# Patient Record
Sex: Female | Born: 1960 | Race: White | Hispanic: No | Smoking: Never smoker
Health system: Southern US, Community
[De-identification: ages and names within clinical notes are randomized; demographics above are authoritative.]

## PROBLEM LIST (undated history)

## (undated) DIAGNOSIS — I499 Cardiac arrhythmia, unspecified: Secondary | ICD-10-CM

## (undated) DIAGNOSIS — J45909 Unspecified asthma, uncomplicated: Secondary | ICD-10-CM

## (undated) DIAGNOSIS — H269 Unspecified cataract: Secondary | ICD-10-CM

## (undated) DIAGNOSIS — F329 Major depressive disorder, single episode, unspecified: Secondary | ICD-10-CM

## (undated) DIAGNOSIS — M1990 Unspecified osteoarthritis, unspecified site: Secondary | ICD-10-CM

## (undated) DIAGNOSIS — F32A Depression, unspecified: Secondary | ICD-10-CM

## (undated) DIAGNOSIS — K589 Irritable bowel syndrome without diarrhea: Secondary | ICD-10-CM

## (undated) HISTORY — PX: CHOLECYSTECTOMY: SHX55

## (undated) HISTORY — PX: OOPHORECTOMY: SHX86

## (undated) HISTORY — PX: TONSILLECTOMY: SUR1361

## (undated) HISTORY — PX: ABDOMINAL HYSTERECTOMY: SHX81

---

## 1998-11-27 ENCOUNTER — Ambulatory Visit (HOSPITAL_COMMUNITY): Admission: RE | Admit: 1998-11-27 | Discharge: 1998-11-27 | Payer: Self-pay | Admitting: General Practice

## 1998-11-27 ENCOUNTER — Encounter: Payer: Self-pay | Admitting: General Practice

## 2006-11-13 ENCOUNTER — Emergency Department: Payer: Self-pay | Admitting: Emergency Medicine

## 2007-04-13 ENCOUNTER — Ambulatory Visit: Payer: Self-pay | Admitting: Internal Medicine

## 2009-06-10 ENCOUNTER — Ambulatory Visit: Payer: Self-pay | Admitting: Internal Medicine

## 2009-06-18 ENCOUNTER — Ambulatory Visit: Payer: Self-pay | Admitting: Internal Medicine

## 2009-07-08 ENCOUNTER — Ambulatory Visit: Payer: Self-pay | Admitting: Pain Medicine

## 2009-07-29 ENCOUNTER — Ambulatory Visit: Payer: Self-pay | Admitting: Pain Medicine

## 2009-08-19 ENCOUNTER — Ambulatory Visit: Payer: Self-pay | Admitting: Pain Medicine

## 2009-09-04 ENCOUNTER — Ambulatory Visit: Payer: Self-pay | Admitting: Pain Medicine

## 2011-08-11 ENCOUNTER — Ambulatory Visit: Payer: Self-pay | Admitting: Internal Medicine

## 2015-04-04 ENCOUNTER — Other Ambulatory Visit: Payer: Self-pay | Admitting: Specialist

## 2015-04-04 DIAGNOSIS — J9801 Acute bronchospasm: Secondary | ICD-10-CM

## 2015-04-09 ENCOUNTER — Ambulatory Visit
Admission: RE | Admit: 2015-04-09 | Discharge: 2015-04-09 | Disposition: A | Discharge status: Home / Self Care | Payer: BLUE CROSS/BLUE SHIELD | Source: Ambulatory Visit | Attending: Specialist | Admitting: Specialist

## 2015-04-09 DIAGNOSIS — R918 Other nonspecific abnormal finding of lung field: Secondary | ICD-10-CM | POA: Diagnosis not present

## 2015-04-09 DIAGNOSIS — J9801 Acute bronchospasm: Secondary | ICD-10-CM | POA: Diagnosis present

## 2015-04-09 DIAGNOSIS — Z9049 Acquired absence of other specified parts of digestive tract: Secondary | ICD-10-CM | POA: Insufficient documentation

## 2015-04-09 DIAGNOSIS — R05 Cough: Secondary | ICD-10-CM | POA: Insufficient documentation

## 2015-11-04 ENCOUNTER — Other Ambulatory Visit: Payer: Self-pay | Admitting: Orthopedic Surgery

## 2015-11-04 DIAGNOSIS — M2391 Unspecified internal derangement of right knee: Secondary | ICD-10-CM

## 2015-11-04 DIAGNOSIS — M1711 Unilateral primary osteoarthritis, right knee: Secondary | ICD-10-CM

## 2015-11-10 ENCOUNTER — Ambulatory Visit: Payer: BLUE CROSS/BLUE SHIELD

## 2015-11-13 ENCOUNTER — Encounter: Payer: BLUE CROSS/BLUE SHIELD | Admitting: Physical Therapy

## 2015-11-14 ENCOUNTER — Encounter: Payer: Self-pay | Admitting: Radiology

## 2015-11-14 ENCOUNTER — Ambulatory Visit
Admission: RE | Admit: 2015-11-14 | Discharge: 2015-11-14 | Disposition: A | Discharge status: Home / Self Care | Payer: BLUE CROSS/BLUE SHIELD | Source: Ambulatory Visit | Attending: Orthopedic Surgery | Admitting: Orthopedic Surgery

## 2015-11-14 DIAGNOSIS — M1711 Unilateral primary osteoarthritis, right knee: Secondary | ICD-10-CM | POA: Diagnosis present

## 2015-11-14 DIAGNOSIS — M2391 Unspecified internal derangement of right knee: Secondary | ICD-10-CM

## 2015-11-18 ENCOUNTER — Encounter: Payer: BLUE CROSS/BLUE SHIELD | Admitting: Physical Therapy

## 2015-11-20 ENCOUNTER — Encounter: Payer: BLUE CROSS/BLUE SHIELD | Admitting: Physical Therapy

## 2017-01-29 DIAGNOSIS — H269 Unspecified cataract: Secondary | ICD-10-CM

## 2017-01-29 HISTORY — DX: Unspecified cataract: H26.9

## 2017-02-18 ENCOUNTER — Encounter: Payer: Self-pay | Admitting: *Deleted

## 2017-02-23 ENCOUNTER — Encounter: Payer: Self-pay | Admitting: *Deleted

## 2017-02-23 ENCOUNTER — Other Ambulatory Visit: Payer: Self-pay

## 2017-02-23 ENCOUNTER — Ambulatory Visit: Payer: BLUE CROSS/BLUE SHIELD | Admitting: Anesthesiology

## 2017-02-23 ENCOUNTER — Ambulatory Visit
Admission: RE | Admit: 2017-02-23 | Discharge: 2017-02-23 | Disposition: A | Discharge status: Home / Self Care | Payer: BLUE CROSS/BLUE SHIELD | Source: Ambulatory Visit | Attending: Ophthalmology | Admitting: Ophthalmology

## 2017-02-23 ENCOUNTER — Encounter
Admission: RE | Disposition: A | Discharge status: Home / Self Care | Payer: Self-pay | Source: Ambulatory Visit | Attending: Ophthalmology

## 2017-02-23 DIAGNOSIS — Z885 Allergy status to narcotic agent status: Secondary | ICD-10-CM | POA: Insufficient documentation

## 2017-02-23 DIAGNOSIS — J449 Chronic obstructive pulmonary disease, unspecified: Secondary | ICD-10-CM | POA: Diagnosis not present

## 2017-02-23 DIAGNOSIS — M1990 Unspecified osteoarthritis, unspecified site: Secondary | ICD-10-CM | POA: Diagnosis not present

## 2017-02-23 DIAGNOSIS — K589 Irritable bowel syndrome without diarrhea: Secondary | ICD-10-CM | POA: Diagnosis not present

## 2017-02-23 DIAGNOSIS — Z79899 Other long term (current) drug therapy: Secondary | ICD-10-CM | POA: Diagnosis not present

## 2017-02-23 DIAGNOSIS — Z7982 Long term (current) use of aspirin: Secondary | ICD-10-CM | POA: Diagnosis not present

## 2017-02-23 DIAGNOSIS — Z91040 Latex allergy status: Secondary | ICD-10-CM | POA: Diagnosis not present

## 2017-02-23 DIAGNOSIS — F329 Major depressive disorder, single episode, unspecified: Secondary | ICD-10-CM | POA: Diagnosis not present

## 2017-02-23 DIAGNOSIS — Z9049 Acquired absence of other specified parts of digestive tract: Secondary | ICD-10-CM | POA: Insufficient documentation

## 2017-02-23 DIAGNOSIS — Z90710 Acquired absence of both cervix and uterus: Secondary | ICD-10-CM | POA: Insufficient documentation

## 2017-02-23 DIAGNOSIS — Z79890 Hormone replacement therapy (postmenopausal): Secondary | ICD-10-CM | POA: Insufficient documentation

## 2017-02-23 DIAGNOSIS — H2512 Age-related nuclear cataract, left eye: Secondary | ICD-10-CM | POA: Diagnosis present

## 2017-02-23 HISTORY — DX: Major depressive disorder, single episode, unspecified: F32.9

## 2017-02-23 HISTORY — DX: Irritable bowel syndrome without diarrhea: K58.9

## 2017-02-23 HISTORY — DX: Unspecified cataract: H26.9

## 2017-02-23 HISTORY — DX: Unspecified asthma, uncomplicated: J45.909

## 2017-02-23 HISTORY — DX: Unspecified osteoarthritis, unspecified site: M19.90

## 2017-02-23 HISTORY — DX: Depression, unspecified: F32.A

## 2017-02-23 HISTORY — DX: Cardiac arrhythmia, unspecified: I49.9

## 2017-02-23 HISTORY — PX: CATARACT EXTRACTION W/PHACO: SHX586

## 2017-02-23 LAB — GLUCOSE, CAPILLARY: Glucose-Capillary: 97 mg/dL (ref 65–99)

## 2017-02-23 SURGERY — PHACOEMULSIFICATION, CATARACT, WITH IOL INSERTION
Anesthesia: Monitor Anesthesia Care | Site: Eye | Laterality: Left | Wound class: Clean

## 2017-02-23 MED ORDER — MIDAZOLAM HCL 2 MG/2ML IJ SOLN
INTRAMUSCULAR | Status: DC | PRN
  Administered 2017-02-23: 2 mg via INTRAVENOUS

## 2017-02-23 MED ORDER — NA CHONDROIT SULF-NA HYALURON 40-17 MG/ML IO SOLN
INTRAOCULAR | Status: AC
  Filled 2017-02-23: qty 1

## 2017-02-23 MED ORDER — ARMC OPHTHALMIC DILATING DROPS
1.0000 "application " | OPHTHALMIC | Status: AC
  Administered 2017-02-23 (×3): 1 via OPHTHALMIC

## 2017-02-23 MED ORDER — SODIUM CHLORIDE 0.9 % IV SOLN
INTRAVENOUS | Status: DC
Start: 2017-02-23 — End: 2017-02-23
  Administered 2017-02-23 (×2): via INTRAVENOUS

## 2017-02-23 MED ORDER — ONDANSETRON HCL 4 MG/2ML IJ SOLN
INTRAMUSCULAR | Status: AC
  Filled 2017-02-23: qty 2

## 2017-02-23 MED ORDER — EPINEPHRINE PF 1 MG/ML IJ SOLN
INTRAOCULAR | Status: DC | PRN
  Administered 2017-02-23: 1 mL via OPHTHALMIC

## 2017-02-23 MED ORDER — ARMC OPHTHALMIC DILATING DROPS
OPHTHALMIC | Status: AC
  Administered 2017-02-23: 1 via OPHTHALMIC
  Filled 2017-02-23: qty 0.4

## 2017-02-23 MED ORDER — ONDANSETRON HCL 4 MG/2ML IJ SOLN
INTRAMUSCULAR | Status: DC | PRN
  Administered 2017-02-23: 4 mg via INTRAVENOUS

## 2017-02-23 MED ORDER — MIDAZOLAM HCL 2 MG/2ML IJ SOLN
INTRAMUSCULAR | Status: AC
  Filled 2017-02-23: qty 2

## 2017-02-23 MED ORDER — CARBACHOL 0.01 % IO SOLN
INTRAOCULAR | Status: DC | PRN
  Administered 2017-02-23: .5 mL via INTRAOCULAR

## 2017-02-23 MED ORDER — MOXIFLOXACIN HCL 0.5 % OP SOLN
OPHTHALMIC | Status: AC
  Filled 2017-02-23: qty 3

## 2017-02-23 MED ORDER — MOXIFLOXACIN HCL 0.5 % OP SOLN: 1.0000 [drp] | Freq: Once | OPHTHALMIC | Status: DC

## 2017-02-23 MED ORDER — MOXIFLOXACIN HCL 0.5 % OP SOLN
OPHTHALMIC | Status: DC | PRN
  Administered 2017-02-23: .2 mL via OPHTHALMIC

## 2017-02-23 MED ORDER — FENTANYL CITRATE (PF) 100 MCG/2ML IJ SOLN
INTRAMUSCULAR | Status: DC | PRN
  Administered 2017-02-23 (×2): 50 ug via INTRAVENOUS

## 2017-02-23 MED ORDER — NA CHONDROIT SULF-NA HYALURON 40-17 MG/ML IO SOLN
INTRAOCULAR | Status: DC | PRN
  Administered 2017-02-23: 1 mL via INTRAOCULAR

## 2017-02-23 MED ORDER — FENTANYL CITRATE (PF) 100 MCG/2ML IJ SOLN
INTRAMUSCULAR | Status: AC
  Filled 2017-02-23: qty 2

## 2017-02-23 MED ORDER — POVIDONE-IODINE 5 % OP SOLN
OPHTHALMIC | Status: DC | PRN
  Administered 2017-02-23: 1 via OPHTHALMIC

## 2017-02-23 MED ORDER — LIDOCAINE HCL (PF) 4 % IJ SOLN
INTRAOCULAR | Status: DC | PRN
  Administered 2017-02-23: 2 mL via OPHTHALMIC

## 2017-02-23 SURGICAL SUPPLY — 18 items
GLOVE BIO SURGEON STRL SZ8 (GLOVE) ×3 IMPLANT
GLOVE BIOGEL M 6.5 STRL (GLOVE) ×3 IMPLANT
GLOVE SURG LX 8.0 MICRO (GLOVE) ×2
GLOVE SURG LX STRL 8.0 MICRO (GLOVE) ×1 IMPLANT
GOWN STRL REUS W/ TWL LRG LVL3 (GOWN DISPOSABLE) ×2 IMPLANT
GOWN STRL REUS W/TWL LRG LVL3 (GOWN DISPOSABLE) ×6
LABEL CATARACT MEDS ST (LABEL) ×3 IMPLANT
LENS IOL ACRSF IQ TRC 8 11.0 IMPLANT
LENS IOL ACRYSOF IQ TORIC 11.0 ×3 IMPLANT
LENS IOL IQ TORIC 8 11.0 ×1 IMPLANT
PACK CATARACT (MISCELLANEOUS) ×3 IMPLANT
PACK CATARACT BRASINGTON LX (MISCELLANEOUS) ×3 IMPLANT
PACK EYE AFTER SURG (MISCELLANEOUS) ×3 IMPLANT
SOL BSS BAG (MISCELLANEOUS) ×3
SOLUTION BSS BAG (MISCELLANEOUS) ×1 IMPLANT
SYR 5ML LL (SYRINGE) ×3 IMPLANT
WATER STERILE IRR 250ML POUR (IV SOLUTION) ×3 IMPLANT
WIPE NON LINTING 3.25X3.25 (MISCELLANEOUS) ×3 IMPLANT

## 2017-02-23 NOTE — Anesthesia Preprocedure Evaluation (Signed)
Anesthesia Evaluation  Patient identified by MRN, date of birth, ID band Patient awake    Reviewed: Allergy & Precautions, NPO status , Patient's Chart, lab work & pertinent test results, reviewed documented beta blocker date and time   Airway Mallampati: III  TM Distance: >3 FB     Dental  (+) Chipped   Pulmonary asthma ,           Cardiovascular + dysrhythmias      Neuro/Psych PSYCHIATRIC DISORDERS Depression    GI/Hepatic   Endo/Other    Renal/GU      Musculoskeletal  (+) Arthritis ,   Abdominal   Peds  Hematology   Anesthesia Other Findings Obese.  Reproductive/Obstetrics                             Anesthesia Physical Anesthesia Plan  ASA: III  Anesthesia Plan: MAC   Post-op Pain Management:    Induction:   PONV Risk Score and Plan:   Airway Management Planned:   Additional Equipment:   Intra-op Plan:   Post-operative Plan:   Informed Consent: I have reviewed the patients History and Physical, chart, labs and discussed the procedure including the risks, benefits and alternatives for the proposed anesthesia with the patient or authorized representative who has indicated his/her understanding and acceptance.     Plan Discussed with: CRNA  Anesthesia Plan Comments:         Anesthesia Quick Evaluation

## 2017-02-23 NOTE — Op Note (Signed)
PREOPERATIVE DIAGNOSIS:  Nuclear sclerotic cataract of the left eye.   POSTOPERATIVE DIAGNOSIS:  Nuclear sclerotic cataract of the left eye.   OPERATIVE PROCEDURE: Procedure(s): CATARACT EXTRACTION PHACO AND INTRAOCULAR LENS PLACEMENT (IOC)   SURGEON:  Galen ManilaWilliam Makenize Messman, MD.   ANESTHESIA: 1.      Managed anesthesia care. 2.     0.191ml os Shugarcaine was instilled following the paracentesis [email or web address]@   COMPLICATIONS:  None.   TECHNIQUE:   Stop and chop    DESCRIPTION OF PROCEDURE:  The patient was examined and consented in the preoperative holding area where the aforementioned topical anesthesia was applied to the left eye.  The patient was brought back to the Operating Room where he was sat upright on the gurney and given a target to fixate upon while the eye was marked at the 3:00 and 9:00 position.  The patient was then reclined on the operating table.  The eye was prepped and draped in the usual sterile ophthalmic fashion and a lid speculum was placed. A paracentesis was created with the side port blade and the anterior chamber was filled with viscoelastic. A near clear corneal incision was performed with the steel keratome. A continuous curvilinear capsulorrhexis was performed with a cystotome followed by the capsulorrhexis forceps. Hydrodissection and hydrodelineation were carried out with BSS on a blunt cannula. The lens was removed in a stop and chop technique and the remaining cortical material was removed with the irrigation-aspiration handpiece. The eye was inflated with viscoelastic and the ZCT lens was placed in the eye and rotated to within a few degrees of the predetermined orientation.  The remaining viscoelastic was removed from the eye.  The Sinskey hook was used to rotate the toric lens into its final resting place at 110 degrees.  0.1 ml of Vigamox was placed in the anterior chamber. The eye was inflated to a physiologic pressure and found to be watertight.  The eye was dressed  with Vigamox. The patient was given protective glasses to wear throughout the day and a shield with which to sleep tonight. The patient was also given drops with which to begin a drop regimen today and will follow-up with me in one day. Implant Name Type Inv. Item Serial No. Manufacturer Lot No. LRB No. Used  LENS IOL TORIC 11.0 - [id number]  LENS IOL TORIC 11.0 [id number] ALCON  Left 1   Procedure(s) with comments: CATARACT EXTRACTION PHACO AND INTRAOCULAR LENS PLACEMENT (I[facility]) (Left) - US 00:43 AP% 15.8 CDE 6.87 Fluid pack lot # [id number]  Electronically signed: Devinn Voshell [place] 12/26/20189:21 AM

## 2017-02-23 NOTE — Transfer of Care (Signed)
Immediate Anesthesia Transfer of Care Note  Patient: Haley Booker  Procedure(s) Performed: CATARACT EXTRACTION PHACO AND INTRAOCULAR LENS PLACEMENT ([place]) (Left Eye)  Patient Location: PACU  Anesthesia Type:MAC  Level of Consciousness: awake  Airway & Oxygen Therapy: Patient Spontanous Breathing  Post-op Assessment: Report given to RN and Post -op Vital signs reviewed and stable  Post vital signs: Reviewed and stable  Last Vitals:  Vitals:   02/23/17 0744 02/23/17 0922  BP: 138/69 122/74  Pulse: 66 70  Resp: 18 20  Temp:  (!) 36.4 C  SpO2: 99% 100%    Last Pain:  Vitals:   02/23/17 0922  TempSrc: Temporal  PainSc:          Complications: No apparent anesthesia complications

## 2017-02-23 NOTE — Anesthesia Post-op Follow-up Note (Signed)
Anesthesia QCDR form completed.        

## 2017-02-23 NOTE — Discharge Instructions (Signed)

## 2017-02-23 NOTE — H&P (Signed)
All labs reviewed. Abnormal studies sent to patients PCP when indicated.  Previous H&P reviewed, patient examined, there are NO CHANGES.  Haley Flurry LOUIS12/26/20188:45 AM

## 2017-02-23 NOTE — Anesthesia Postprocedure Evaluation (Signed)
Anesthesia Post Note  Patient: Elani Delph  Procedure(s) Performed: CATARACT EXTRACTION PHACO AND INTRAOCULAR LENS PLACEMENT ([place]) (Left Eye)  Patient location during evaluation: PACU Anesthesia Type: MAC Level of consciousness: awake, awake and alert, oriented and patient cooperative Pain management: pain level controlled Vital Signs Assessment: post-procedure vital signs reviewed and stable Respiratory status: spontaneous breathing, nonlabored ventilation and respiratory function stable Cardiovascular status: stable Anesthetic complications: no     Last Vitals:  Vitals:   02/23/17 0744 02/23/17 0922  BP: 138/69 122/74  Pulse: 66 70  Resp: 18 20  Temp:  (!) 36.4 C  SpO2: 99% 100%    Last Pain:  Vitals:   02/23/17 0922  TempSrc: Temporal  PainSc:                  Ricki Miller

## 2017-02-23 NOTE — Anesthesia Procedure Notes (Signed)
Procedure Name: MAC Date/Time: 02/23/2017 8:59 AM Performed by: Nelda Marseille, CRNA Pre-anesthesia Checklist: Patient identified, Emergency Drugs available, Suction available, Patient being monitored and Timeout performed Oxygen Delivery Method: Nasal cannula

## 2017-02-24 ENCOUNTER — Encounter: Payer: Self-pay | Admitting: Ophthalmology

## 2017-03-03 ENCOUNTER — Encounter: Payer: Self-pay | Admitting: *Deleted

## 2017-03-08 ENCOUNTER — Ambulatory Visit: Payer: BLUE CROSS/BLUE SHIELD | Admitting: Certified Registered Nurse Anesthetist

## 2017-03-08 ENCOUNTER — Ambulatory Visit
Admission: RE | Admit: 2017-03-08 | Discharge: 2017-03-08 | Disposition: A | Discharge status: Home / Self Care | Payer: BLUE CROSS/BLUE SHIELD | Source: Ambulatory Visit | Attending: Ophthalmology | Admitting: Ophthalmology

## 2017-03-08 ENCOUNTER — Encounter
Admission: RE | Disposition: A | Discharge status: Home / Self Care | Payer: Self-pay | Source: Ambulatory Visit | Attending: Ophthalmology

## 2017-03-08 DIAGNOSIS — Z885 Allergy status to narcotic agent status: Secondary | ICD-10-CM | POA: Diagnosis not present

## 2017-03-08 DIAGNOSIS — Z79899 Other long term (current) drug therapy: Secondary | ICD-10-CM | POA: Diagnosis not present

## 2017-03-08 DIAGNOSIS — F329 Major depressive disorder, single episode, unspecified: Secondary | ICD-10-CM | POA: Insufficient documentation

## 2017-03-08 DIAGNOSIS — Z888 Allergy status to other drugs, medicaments and biological substances status: Secondary | ICD-10-CM | POA: Insufficient documentation

## 2017-03-08 DIAGNOSIS — M1990 Unspecified osteoarthritis, unspecified site: Secondary | ICD-10-CM | POA: Insufficient documentation

## 2017-03-08 DIAGNOSIS — H2511 Age-related nuclear cataract, right eye: Secondary | ICD-10-CM | POA: Diagnosis not present

## 2017-03-08 DIAGNOSIS — Z7982 Long term (current) use of aspirin: Secondary | ICD-10-CM | POA: Insufficient documentation

## 2017-03-08 DIAGNOSIS — K589 Irritable bowel syndrome without diarrhea: Secondary | ICD-10-CM | POA: Insufficient documentation

## 2017-03-08 DIAGNOSIS — Z91040 Latex allergy status: Secondary | ICD-10-CM | POA: Insufficient documentation

## 2017-03-08 DIAGNOSIS — J42 Unspecified chronic bronchitis: Secondary | ICD-10-CM | POA: Diagnosis not present

## 2017-03-08 HISTORY — PX: CATARACT EXTRACTION W/PHACO: SHX586

## 2017-03-08 SURGERY — PHACOEMULSIFICATION, CATARACT, WITH IOL INSERTION
Anesthesia: Monitor Anesthesia Care | Site: Eye | Laterality: Right | Wound class: Clean

## 2017-03-08 MED ORDER — MOXIFLOXACIN HCL 0.5 % OP SOLN: 1.0000 [drp] | OPHTHALMIC | Status: DC | PRN

## 2017-03-08 MED ORDER — MOXIFLOXACIN HCL 0.5 % OP SOLN
OPHTHALMIC | Status: AC
  Filled 2017-03-08: qty 3

## 2017-03-08 MED ORDER — BSS IO SOLN
INTRAOCULAR | Status: DC | PRN
  Administered 2017-03-08: 15 mL via INTRAOCULAR

## 2017-03-08 MED ORDER — LIDOCAINE HCL (PF) 4 % IJ SOLN
INTRAMUSCULAR | Status: AC
  Filled 2017-03-08: qty 5

## 2017-03-08 MED ORDER — POVIDONE-IODINE 5 % OP SOLN
OPHTHALMIC | Status: DC | PRN
  Administered 2017-03-08: 1 via OPHTHALMIC

## 2017-03-08 MED ORDER — CARBACHOL 0.01 % IO SOLN: INTRAOCULAR | Status: DC | PRN

## 2017-03-08 MED ORDER — POVIDONE-IODINE 5 % OP SOLN
OPHTHALMIC | Status: AC
  Filled 2017-03-08: qty 30

## 2017-03-08 MED ORDER — KETOROLAC TROMETHAMINE 30 MG/ML IJ SOLN
INTRAMUSCULAR | Status: DC | PRN
  Administered 2017-03-08: 30 mg via INTRAVENOUS

## 2017-03-08 MED ORDER — NA CHONDROIT SULF-NA HYALURON 40-17 MG/ML IO SOLN
INTRAOCULAR | Status: DC | PRN
  Administered 2017-03-08: 1 mL via INTRAOCULAR

## 2017-03-08 MED ORDER — LIDOCAINE HCL (PF) 4 % IJ SOLN
INTRAOCULAR | Status: DC | PRN
  Administered 2017-03-08: 4 mL via OPHTHALMIC

## 2017-03-08 MED ORDER — EPINEPHRINE PF 1 MG/ML IJ SOLN
INTRAOCULAR | Status: DC | PRN
  Administered 2017-03-08: 11:00:00 via OPHTHALMIC

## 2017-03-08 MED ORDER — EPINEPHRINE PF 1 MG/ML IJ SOLN
INTRAMUSCULAR | Status: AC
  Filled 2017-03-08: qty 2

## 2017-03-08 MED ORDER — ARMC OPHTHALMIC DILATING DROPS
OPHTHALMIC | Status: AC
  Administered 2017-03-08: 1 via OPHTHALMIC
  Filled 2017-03-08: qty 0.4

## 2017-03-08 MED ORDER — ARMC OPHTHALMIC DILATING DROPS
1.0000 "application " | OPHTHALMIC | Status: AC
  Administered 2017-03-08 (×3): 1 via OPHTHALMIC

## 2017-03-08 MED ORDER — NA CHONDROIT SULF-NA HYALURON 40-17 MG/ML IO SOLN
INTRAOCULAR | Status: AC
  Filled 2017-03-08: qty 1

## 2017-03-08 MED ORDER — SODIUM CHLORIDE 0.9 % IV SOLN
INTRAVENOUS | Status: DC
  Administered 2017-03-08: 10:00:00 via INTRAVENOUS

## 2017-03-08 MED ORDER — MIDAZOLAM HCL 2 MG/2ML IJ SOLN
INTRAMUSCULAR | Status: AC
  Filled 2017-03-08: qty 2

## 2017-03-08 MED ORDER — MIDAZOLAM HCL 2 MG/2ML IJ SOLN
INTRAMUSCULAR | Status: DC | PRN
  Administered 2017-03-08 (×2): 1 mg via INTRAVENOUS

## 2017-03-08 MED ORDER — MOXIFLOXACIN HCL 0.5 % OP SOLN
OPHTHALMIC | Status: DC | PRN
  Administered 2017-03-08: 0.2 mL via OPHTHALMIC

## 2017-03-08 SURGICAL SUPPLY — 18 items
GLOVE BIO SURGEON STRL SZ8 (GLOVE) ×3 IMPLANT
GLOVE BIOGEL M 6.5 STRL (GLOVE) ×3 IMPLANT
GLOVE SURG LX 8.0 MICRO (GLOVE) ×2
GLOVE SURG LX STRL 8.0 MICRO (GLOVE) ×1 IMPLANT
GOWN STRL REUS W/ TWL LRG LVL3 (GOWN DISPOSABLE) ×2 IMPLANT
GOWN STRL REUS W/TWL LRG LVL3 (GOWN DISPOSABLE) ×6
LABEL CATARACT MEDS ST (LABEL) ×3 IMPLANT
LENS IOL ACRSF IQ TRC 4 10.5 IMPLANT
LENS IOL ACRYSOF IQ TORIC 10.5 ×3 IMPLANT
LENS IOL IQ TORIC 4 10.5 ×1 IMPLANT
PACK CATARACT (MISCELLANEOUS) ×3 IMPLANT
PACK CATARACT BRASINGTON LX (MISCELLANEOUS) ×3 IMPLANT
PACK EYE AFTER SURG (MISCELLANEOUS) ×3 IMPLANT
SOL BSS BAG (MISCELLANEOUS) ×3
SOLUTION BSS BAG (MISCELLANEOUS) ×1 IMPLANT
SYR 5ML LL (SYRINGE) ×3 IMPLANT
WATER STERILE IRR 250ML POUR (IV SOLUTION) ×3 IMPLANT
WIPE NON LINTING 3.25X3.25 (MISCELLANEOUS) ×3 IMPLANT

## 2017-03-08 NOTE — Anesthesia Postprocedure Evaluation (Signed)
Anesthesia Post Note  Patient: Haley Booker  Procedure(s) Performed: CATARACT EXTRACTION PHACO AND INTRAOCULAR LENS PLACEMENT ([place]) (Right Eye)  Patient location during evaluation: Short Stay Anesthesia Type: MAC Level of consciousness: awake and alert Pain management: pain level controlled Vital Signs Assessment: post-procedure vital signs reviewed and stable Respiratory status: spontaneous breathing and respiratory function stable Cardiovascular status: blood pressure returned to baseline Postop Assessment: no headache, no backache, no apparent nausea or vomiting and adequate PO intake     Last Vitals:  Vitals:   03/08/17 0926  BP: (!) 132/98  Pulse: 66  Resp: 20  Temp: (!) 36 C  SpO2: 97%    Last Pain:  Vitals:   03/08/17 0926  TempSrc: Temporal                 Eben Burow

## 2017-03-08 NOTE — Op Note (Signed)
PREOPERATIVE DIAGNOSIS:  Nuclear sclerotic cataract of the right eye.   POSTOPERATIVE DIAGNOSIS:  Nuclear sclerotic cataract of the right eye.   OPERATIVE PROCEDURE: Procedure(s): CATARACT EXTRACTION PHACO AND INTRAOCULAR LENS PLACEMENT ([facility])   SURGEON:  Galen ManilaWilliam Carmelle Bamberg, MD.   ANESTHESIA: 1.      Managed anesthesia care. 2.     0.271ml of Shugarcaine was instilled following the paracentesis  Anesthesiologist: Lenard SimmerKarenz, Andrew, MD CRNA: Dava NajjarFrazier, Susan, CRNA  COMPLICATIONS:  None.   TECHNIQUE:   Stop and chop    DESCRIPTION OF PROCEDURE:  The patient was examined and consented in the preoperative holding area where the aforementioned topical anesthesia was applied to the right eye.  The patient was brought back to the Operating Room where he was sat upright on the gurney and given a target to fixate upon while the eye was marked at the 3:00 and 9:00 position.  The patient was then reclined on the operating table.  The eye was prepped and draped in the usual sterile ophthalmic fashion and a lid speculum was placed. A paracentesis was created with the side port blade and the anterior chamber was filled with viscoelastic. A near clear corneal incision was performed with the steel keratome. A continuous curvilinear capsulorrhexis was performed with a cystotome followed by the capsulorrhexis forceps. Hydrodissection and hydrodelineation were carried out with BSS on a blunt cannula. The lens was removed in a stop and chop technique and the remaining cortical material was removed with the irrigation-aspiration handpiece. The eye was inflated with viscoelastic and the ZCT  lens  was placed in the eye and rotated to within a few degrees of the predetermined orientation.  The remaining viscoelastic was removed from the eye.  The Sinskey hook was used to rotate the toric lens into its final resting place at 059 degrees.  0. The eye was inflated to a physiologic pressure and found to be watertight. 0.511ml of  Vigamox was placed in the anterior chamber.  The eye was dressed with Vigamox. The patient was given protective glasses to wear throughout the day and a shield with which to sleep tonight. The patient was also given drops with which to begin a drop regimen today and will follow-up with me in one day. Implant Name Type Inv. Item Serial No. Manufacturer Lot No. LRB No. Used  LENS IOL TORIC 10.5 - [id number]  LENS IOL TORIC 10.5 [id number] ALCON  Right 1   Procedure(s) with comments: CATARACT EXTRACTION PHACO AND INTRAOCULAR LENS PLACEMENT (I[facility]) (Right) - US 00:28.5 AP% 19.3 CDE 5.51 Fluid Pack Lot # [id number]  Electronically signed: Gladiola Madore [place] 03/08/2017 11:02 AM

## 2017-03-08 NOTE — Anesthesia Preprocedure Evaluation (Signed)
Anesthesia Evaluation  Patient identified by MRN, date of birth, ID band Patient awake    Reviewed: Allergy & Precautions, NPO status , Patient's Chart, lab work & pertinent test results, reviewed documented beta blocker date and time   Airway Mallampati: III  TM Distance: >3 FB     Dental  (+) Chipped   Pulmonary asthma ,           Cardiovascular + dysrhythmias      Neuro/Psych PSYCHIATRIC DISORDERS    GI/Hepatic   Endo/Other    Renal/GU      Musculoskeletal  (+) Arthritis ,   Abdominal   Peds  Hematology   Anesthesia Other Findings Obese.  Reproductive/Obstetrics                             Anesthesia Physical  Anesthesia Plan  ASA: III  Anesthesia Plan: MAC   Post-op Pain Management:    Induction:   PONV Risk Score and Plan:   Airway Management Planned:   Additional Equipment:   Intra-op Plan:   Post-operative Plan:   Informed Consent: I have reviewed the patients History and Physical, chart, labs and discussed the procedure including the risks, benefits and alternatives for the proposed anesthesia with the patient or authorized representative who has indicated his/her understanding and acceptance.     Plan Discussed with: CRNA  Anesthesia Plan Comments:         Anesthesia Quick Evaluation

## 2017-03-08 NOTE — Discharge Instructions (Signed)
Eye Surgery Discharge Instructions  Expect mild scratchy sensation or mild soreness. DO NOT RUB YOUR EYE!  The day of surgery:  Minimal physical activity, but bed rest is not required  No reading, computer work, or close hand work  No bending, lifting, or straining.  May watch TV  For 24 hours:  No driving, legal decisions, or alcoholic beverages  Safety precautions  Eat anything you prefer: It is better to start with liquids, then soup then solid foods.  _____ Eye patch should be worn until postoperative exam tomorrow.  ____ Solar shield eyeglasses should be worn for comfort in the sunlight/patch while sleeping  Resume all regular medications including aspirin or Coumadin if these were discontinued prior to surgery. You may shower, bathe, shave, or wash your hair. Tylenol may be taken for mild discomfort.  Call your doctor if you experience significant pain, nausea, or vomiting, fever > 101 or other signs of infection. [phone] or [phone] Specific instructions:  Follow-up Information    Galen ManilaPorfilio, William, MD Follow up on 03/09/2017.   Specialty:  Ophthalmology Why:  tomorrow at 4:00 pm. Contact information: [address] [place] [address] [phone]

## 2017-03-08 NOTE — Transfer of Care (Signed)
Immediate Anesthesia Transfer of Care Note  Patient: Haley Booker  Procedure(s) Performed: CATARACT EXTRACTION PHACO AND INTRAOCULAR LENS PLACEMENT ([facility]) (Right Eye)  Patient Location: PACU and Short Stay  Anesthesia Type:MAC  Level of Consciousness: awake, alert , oriented and patient cooperative  Airway & Oxygen Therapy: Patient Spontanous Breathing  Post-op Assessment: Report given to RN and Post -op Vital signs reviewed and stable  Post vital signs: Reviewed and stable  Last Vitals:  Vitals:   03/08/17 0926  BP: (!) 132/98  Pulse: 66  Resp: 20  Temp: (!) 36 C  SpO2: 97%    Last Pain:  Vitals:   03/08/17 0926  TempSrc: Temporal         Complications: No apparent anesthesia complications

## 2017-03-08 NOTE — H&P (Signed)
All labs reviewed. Abnormal studies sent to patients PCP when indicated.  Previous H&P reviewed, patient examined, there are NO CHANGES.  Haley Booker LOUIS1/8/201910:27 AM

## 2017-03-08 NOTE — Anesthesia Post-op Follow-up Note (Signed)
Anesthesia QCDR form completed.        

## 2017-07-11 IMAGING — MR MR KNEE*R* W/O CM
5 series · 39 of 40 positions shown · non-contrast
Comparison: None.

CLINICAL DATA: Right knee pain.  No known injury.

EXAM:
MRI OF THE RIGHT KNEE WITHOUT CONTRAST
TECHNIQUE: Multiplanar, multisequence MR imaging of the knee was performed. No
intravenous contrast was administered.

[Series 3: PD fat-sat · axial · 3.0mm · 0.31mm/px · z∈[-37,+78]mm · 8 of 36 slices shown (1 of 3)]
[im 1/36]
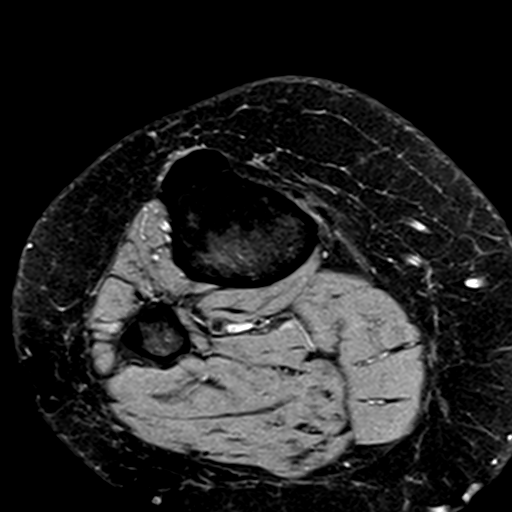
[im 6/36]
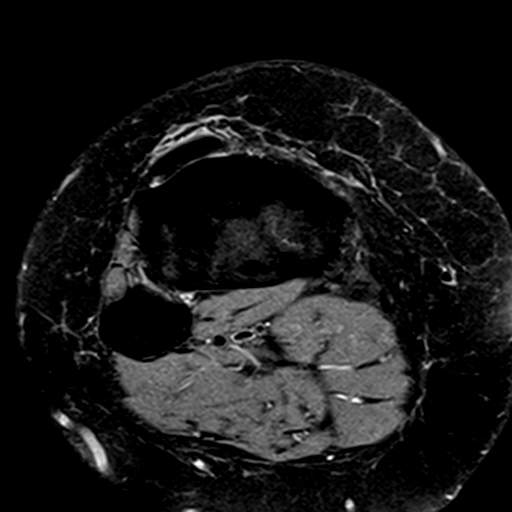
[im 11/36]
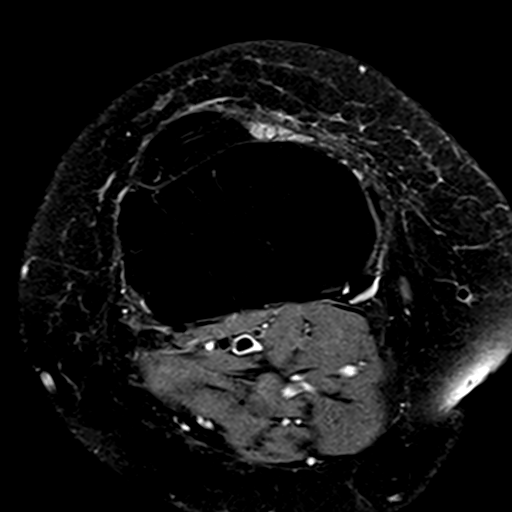
[im 16/36]
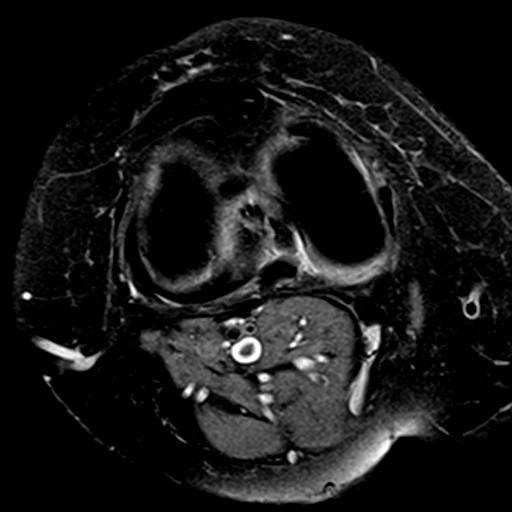
[im 21/36]
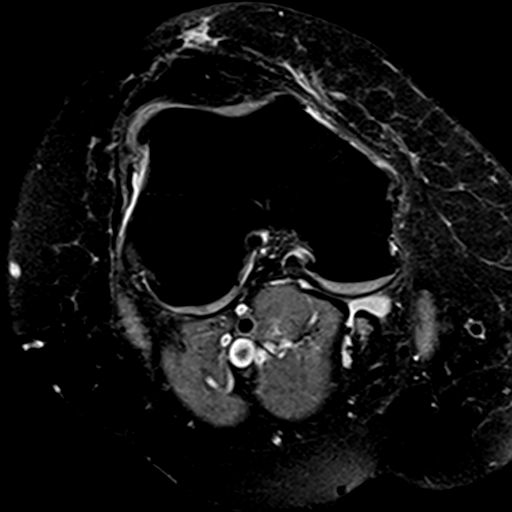
[im 26/36]
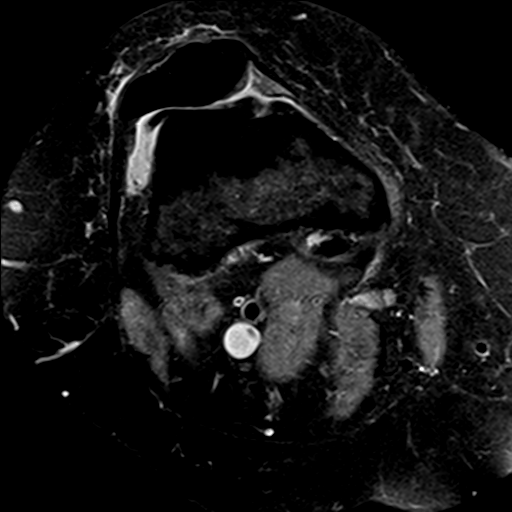
[im 31/36]
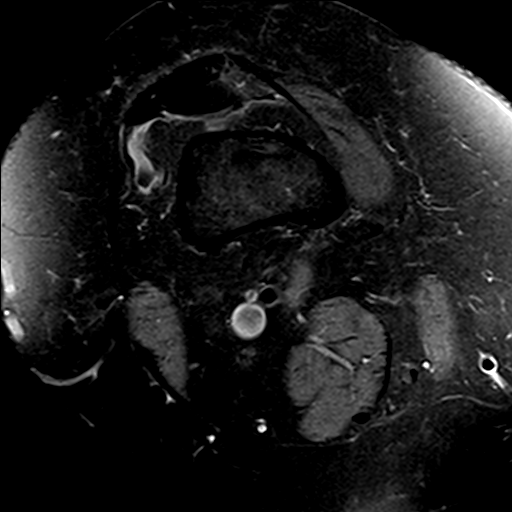
[im 36/36]
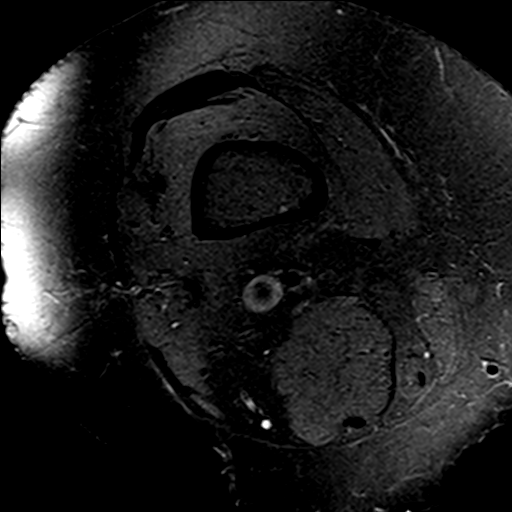

[Series 4: T1 · coronal · 3.0mm · 0.50mm/px · 7 of 32 slices shown]
[im 1/32]
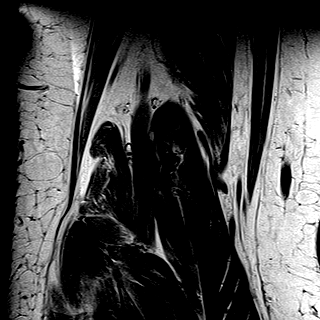
[im 5/32]
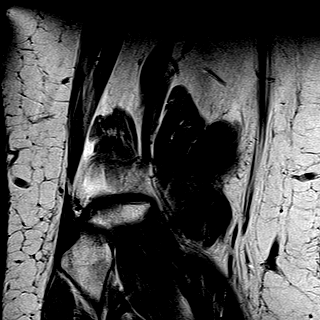
[im 9/32]
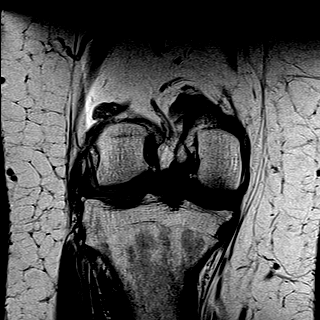
[im 14/32]
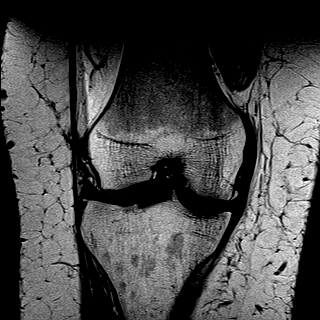
[im 18/32]
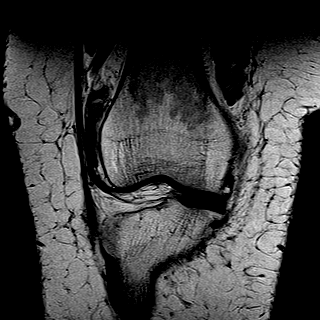
[im 23/32]
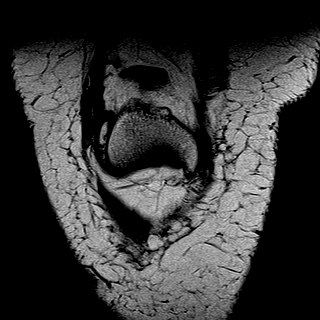
[im 27/32]
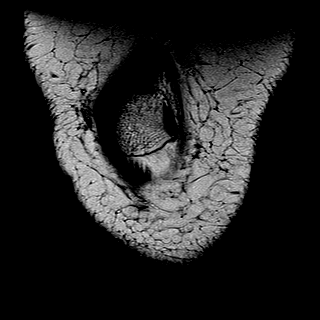

[Series 5: T2 fat-sat · coronal · 3.0mm · 0.50mm/px · 8 of 32 slices shown]
[im 1/32]
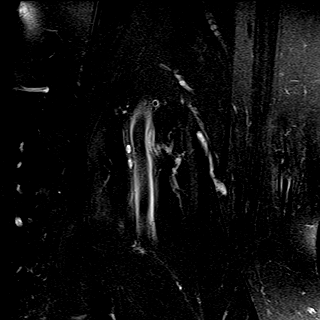
[im 5/32]
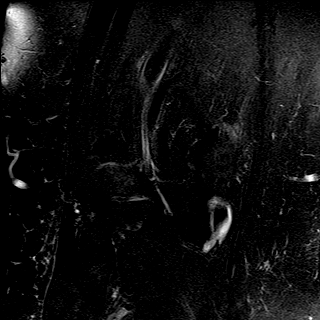
[im 9/32]
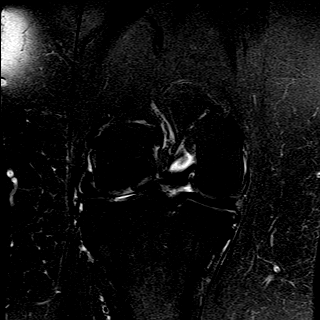
[im 14/32]
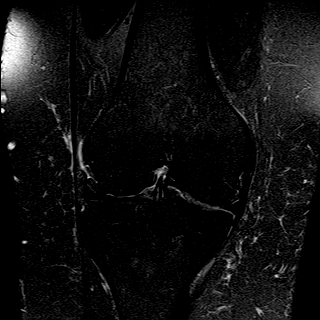
[im 18/32]
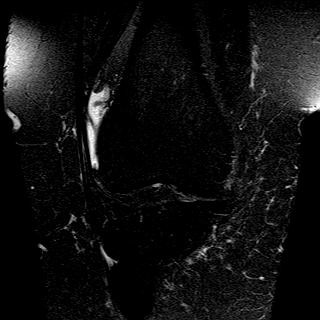
[im 23/32]
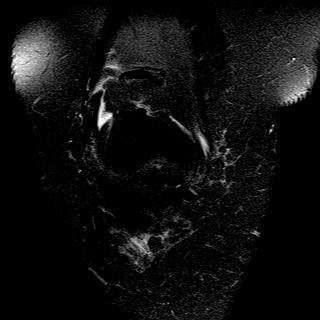
[im 27/32]
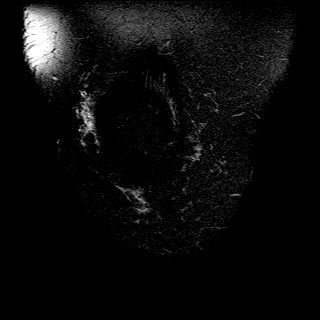
[im 32/32]
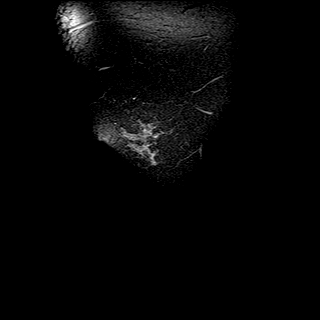

[Series 6: PD fat-sat · coronal · 3.0mm · 0.62mm/px · 8 of 32 slices shown (2 of 3)]
[im 1/32]
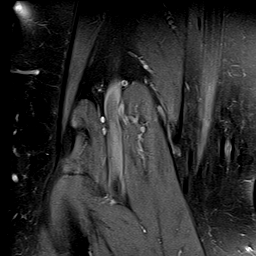
[im 5/32]
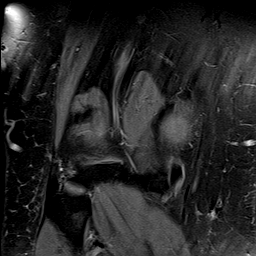
[im 9/32]
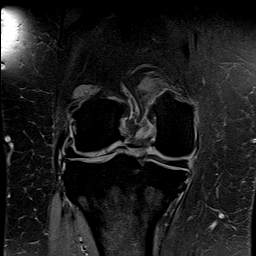
[im 14/32]
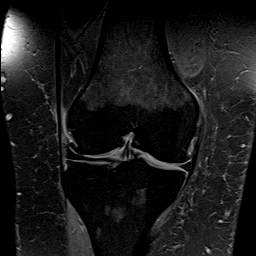
[im 18/32]
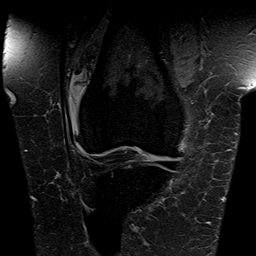
[im 23/32]
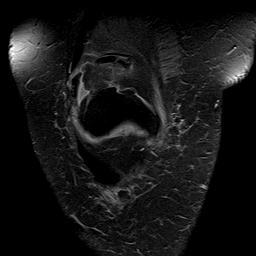
[im 27/32]
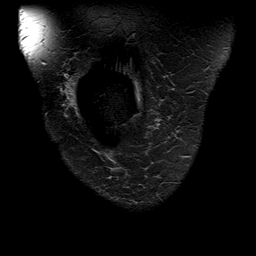
[im 32/32]
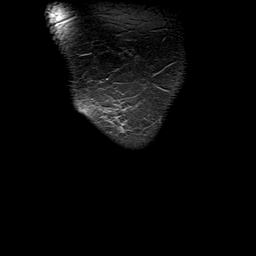

[Series 7: PD fat-sat · sagittal · 3.0mm · 0.62mm/px · 8 of 33 slices shown (3 of 3)]
[im 1/33]
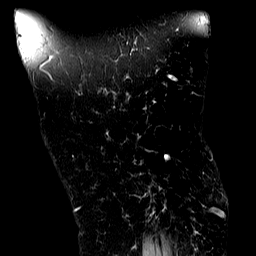
[im 5/33]
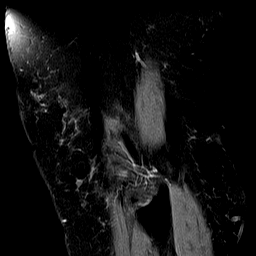
[im 10/33]
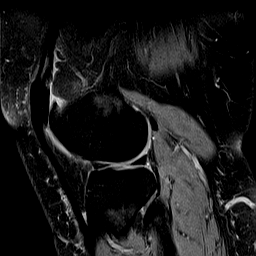
[im 14/33]
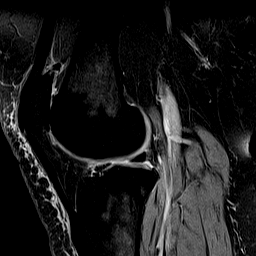
[im 19/33]
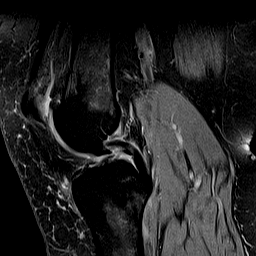
[im 23/33]
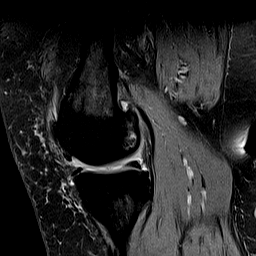
[im 28/33]
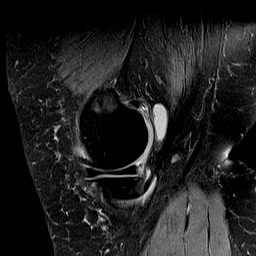
[im 33/33]
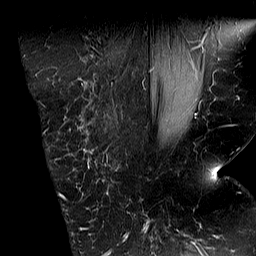

[39 of 40 positions shown; findings below may reference images not displayed]

FINDINGS: MENISCI

Medial meniscus:  Intact.

Lateral meniscus:  Intact.

LIGAMENTS

Cruciates:  Intact ACL and PCL.

Collaterals: Medial collateral ligament is intact. Lateral
collateral ligament complex is intact.

CARTILAGE

Patellofemoral: Full-thickness cartilage loss of the lateral
patellofemoral compartment. Partial-thickness cartilage loss of the
medial patellofemoral compartment.

Medial: Partial-thickness cartilage loss of the medial femoral
condyle and medial tibial plateau.

Lateral: Partial-thickness cartilage loss of the lateral femoral
condyle and lateral tibial plateau most severe along the medial
weight-bearing surface.

Joint: No joint effusion. Normal Hoffa's fat. No plical thickening.

Popliteal Fossa:  Small joint effusion.  Intact popliteus tendon

Extensor Mechanism:  Intact quadriceps tendon and patellar tendon.

Bones:  No marrow signal abnormality.  No fracture or dislocation.

Other: No fluid collection or hematoma.
IMPRESSION: 1. Tricompartmental cartilage abnormalities as described above.
